# Patient Record
Sex: Female | Born: 1995 | Race: White | Hispanic: No | Marital: Single | Smoking: Never smoker
Health system: Southern US, Community
[De-identification: ages and names within clinical notes are randomized; demographics above are authoritative.]

---

## 2015-05-27 ENCOUNTER — Encounter: Payer: Self-pay | Admitting: Family Medicine

## 2015-05-27 ENCOUNTER — Ambulatory Visit (INDEPENDENT_AMBULATORY_CARE_PROVIDER_SITE_OTHER): Payer: Commercial Managed Care - PPO | Admitting: Family Medicine

## 2015-05-27 VITALS — BP 104/66 | HR 51 | Temp 98.2°F | Ht 59.25 in | Wt 129.5 lb

## 2015-05-27 DIAGNOSIS — Z0000 Encounter for general adult medical examination without abnormal findings: Secondary | ICD-10-CM

## 2015-05-27 DIAGNOSIS — Z01419 Encounter for gynecological examination (general) (routine) without abnormal findings: Secondary | ICD-10-CM

## 2015-05-27 LAB — CBC WITH DIFFERENTIAL/PLATELET
BASOS ABS: 0 10*3/uL (ref 0.0–0.1)
BASOS PCT: 0.8 % (ref 0.0–3.0)
Eosinophils Absolute: 0.2 10*3/uL (ref 0.0–0.7)
Eosinophils Relative: 3.4 % (ref 0.0–5.0)
HEMATOCRIT: 40.7 % (ref 36.0–49.0)
HEMOGLOBIN: 13.6 g/dL (ref 12.0–16.0)
LYMPHS ABS: 1.6 10*3/uL (ref 0.7–4.0)
Lymphocytes Relative: 30.6 % (ref 24.0–48.0)
MCHC: 33.6 g/dL (ref 31.0–37.0)
MCV: 90.9 fl (ref 78.0–98.0)
MONOS PCT: 8.7 % (ref 3.0–12.0)
Monocytes Absolute: 0.5 10*3/uL (ref 0.1–1.0)
NEUTROS ABS: 3 10*3/uL (ref 1.4–7.7)
Neutrophils Relative %: 56.5 % (ref 43.0–71.0)
Platelets: 240 10*3/uL (ref 150.0–575.0)
RBC: 4.47 Mil/uL (ref 3.80–5.70)
RDW: 13.8 % (ref 11.4–15.5)
WBC: 5.3 10*3/uL (ref 4.5–13.5)

## 2015-05-27 LAB — COMPREHENSIVE METABOLIC PANEL
ALBUMIN: 4.3 g/dL (ref 3.5–5.2)
ALT: 18 U/L (ref 0–35)
AST: 21 U/L (ref 0–37)
Alkaline Phosphatase: 51 U/L (ref 47–119)
BUN: 10 mg/dL (ref 6–23)
CALCIUM: 9.7 mg/dL (ref 8.4–10.5)
CHLORIDE: 105 meq/L (ref 96–112)
CO2: 28 mEq/L (ref 19–32)
Creatinine, Ser: 0.74 mg/dL (ref 0.40–1.20)
GFR: 106.97 mL/min (ref >60.00)
GLUCOSE: 82 mg/dL (ref 70–99)
POTASSIUM: 4.1 meq/L (ref 3.5–5.1)
SODIUM: 136 meq/L (ref 135–145)
TOTAL PROTEIN: 6.9 g/dL (ref 6.0–8.3)
Total Bilirubin: 0.7 mg/dL (ref 0.2–1.2)

## 2015-05-27 LAB — LIPID PANEL
CHOL/HDL RATIO: 2
Cholesterol: 127 mg/dL (ref 0–200)
HDL: 76.1 mg/dL (ref >39.00)
LDL CALC: 44 mg/dL (ref 0–99)
NonHDL: 50.9
TRIGLYCERIDES: 35 mg/dL (ref 0.0–149.0)
VLDL: 7 mg/dL (ref 0.0–40.0)

## 2015-05-27 LAB — VITAMIN B12: Vitamin B-12: 608 pg/mL (ref 211–911)

## 2015-05-27 LAB — VITAMIN D 25 HYDROXY (VIT D DEFICIENCY, FRACTURES): VITD: 23.52 ng/mL — AB (ref 30.00–100.00)

## 2015-05-27 LAB — TSH: TSH: 1.06 u[IU]/mL (ref 0.40–5.00)

## 2015-05-27 NOTE — Progress Notes (Signed)
Pre visit review using our clinic review tool, if applicable. No additional management support is needed unless otherwise documented below in the visit note. 

## 2015-05-27 NOTE — Patient Instructions (Signed)
It was nice to meet you. We will call you with your lab results and you can view them online. 

## 2015-05-27 NOTE — Assessment & Plan Note (Signed)
Reviewed preventive care protocols, scheduled due services, and updated immunizations Discussed nutrition, exercise, diet, and healthy lifestyle.  Orders Placed This Encounter  Procedures  . CBC with Differential/Platelet  . Comprehensive metabolic panel  . Lipid panel  . TSH  . Vitamin B12  . Vitamin D, 25-hydroxy    

## 2015-05-27 NOTE — Progress Notes (Signed)
Subjective:   Patient ID: Rebecca Williamson, female    DOB: May 13, 1996, 19 y.o.   MRN: [id number]  TESSICA CUPO is a pleasant 19 y.o. year old female who presents to clinic today with Establish Care  on 05/27/2015  HPI:  She would like a CPX.  Virginal.    Does not take any medications regularly.  Has no complaints today. Works as a Conservation officer, [employer].  No current outpatient prescriptions on file prior to visit.   No current facility-administered medications on file prior to visit.    No Known Allergies  History reviewed. No pertinent past medical history.  History reviewed. No pertinent past surgical history.  Family History  Problem Relation Age of Onset  . Migraines Mother   . Lymphoma Mother   . Migraines Sister   . Alcohol abuse Brother   . Alcohol abuse Maternal Uncle   . Stroke Maternal Grandmother   . Cancer Maternal Grandfather   . Hyperlipidemia Maternal Grandfather   . Hypertension Maternal Grandfather     History   Social History  . Marital Status: Single    Spouse Name: N/A  . Number of Children: N/A  . Years of Education: N/A   Occupational History  . Not on file.   Social History Main Topics  . Smoking status: Never Smoker   . Smokeless tobacco: Never Used  . Alcohol Use: No  . Drug Use: No  . Sexual Activity: No   Other Topics Concern  . Not on file   Social History Narrative  . No narrative on file   The PMH, PSH, Social History, Family History, Medications, and allergies have been reviewed in [facility], and have been updated if relevant.   Review of Systems  Constitutional: Negative.   HENT: Negative.   Eyes: Negative.   Respiratory: Negative.   Cardiovascular: Negative.   Gastrointestinal: Negative.   Endocrine: Negative.   Genitourinary: Negative.   Musculoskeletal: Negative.   Skin: Negative.   Allergic/Immunologic: Negative.   Neurological: Negative.   Hematological: Negative.   Psychiatric/Behavioral: Negative.   All other systems  reviewed and are negative.      Objective:    BP 104/66 mmHg  Pulse 51  Temp(Src) 98.2 F (36.8 C) (Oral)  Ht 4' 11.25" (1.505 m)  Wt 129 lb 8 oz (58.741 kg)  BMI 25.93 kg/m2  SpO2 97%  LMP 04/23/2015 (Within Weeks)   Physical Exam    General:  Well-developed,well-nourished,in no acute distress; alert,appropriate and cooperative throughout examination Head:  normocephalic and atraumatic.   Eyes:  vision grossly intact, pupils equal, pupils round, and pupils reactive to light.   Ears:  R ear normal and L ear normal.   Nose:  no external deformity.   Mouth:  good dentition.   Neck:  No deformities, masses, or tenderness noted. Breasts:  No mass, nodules, thickening, tenderness, bulging, retraction, inflamation, nipple discharge or skin changes noted.   Lungs:  Normal respiratory effort, chest expands symmetrically. Lungs are clear to auscultation, no crackles or wheezes. Heart:  Normal rate and regular rhythm. S1 and S2 normal without gallop, murmur, click, rub or other extra sounds. Abdomen:  Bowel sounds positive,abdomen soft and non-tender without masses, organomegaly or hernias noted. Msk:  No deformity or scoliosis noted of thoracic or lumbar spine.   Extremities:  No clubbing, cyanosis, edema, or deformity noted with normal full range of motion of all joints.   Neurologic:  alert & oriented X3 and gait normal.   Skin:  Intact without suspicious lesions or rashes Cervical Nodes:  No lymphadenopathy noted Axillary Nodes:  No palpable lymphadenopathy Psych:  Cognition and judgment appear intact. Alert and cooperative with normal attention span and concentration. No apparent delusions, illusions, hallucinations      Assessment & Plan:   Well woman exam No Follow-up on file.

## 2015-06-02 ENCOUNTER — Encounter: Payer: Self-pay | Admitting: *Deleted

## 2017-05-07 ENCOUNTER — Other Ambulatory Visit (HOSPITAL_COMMUNITY)
Admission: RE | Admit: 2017-05-07 | Discharge: 2017-05-07 | Disposition: A | Discharge status: Home / Self Care | Payer: Commercial Managed Care - PPO | Source: Ambulatory Visit | Attending: Family Medicine | Admitting: Family Medicine

## 2017-05-07 ENCOUNTER — Encounter: Payer: Self-pay | Admitting: Family Medicine

## 2017-05-07 ENCOUNTER — Ambulatory Visit (INDEPENDENT_AMBULATORY_CARE_PROVIDER_SITE_OTHER): Payer: Commercial Managed Care - PPO | Admitting: Family Medicine

## 2017-05-07 VITALS — BP 102/60 | HR 51 | Ht 59.75 in | Wt 122.8 lb

## 2017-05-07 DIAGNOSIS — Z124 Encounter for screening for malignant neoplasm of cervix: Secondary | ICD-10-CM | POA: Diagnosis not present

## 2017-05-07 DIAGNOSIS — Z01411 Encounter for gynecological examination (general) (routine) with abnormal findings: Secondary | ICD-10-CM | POA: Insufficient documentation

## 2017-05-07 DIAGNOSIS — R87610 Atypical squamous cells of undetermined significance on cytologic smear of cervix (ASC-US): Secondary | ICD-10-CM | POA: Insufficient documentation

## 2017-05-07 DIAGNOSIS — Z23 Encounter for immunization: Secondary | ICD-10-CM

## 2017-05-07 DIAGNOSIS — Z01419 Encounter for gynecological examination (general) (routine) without abnormal findings: Secondary | ICD-10-CM | POA: Diagnosis not present

## 2017-05-07 LAB — CBC WITH DIFFERENTIAL/PLATELET
Basophils Absolute: 0.1 10*3/uL (ref 0.0–0.1)
Basophils Relative: 1.6 % (ref 0.0–3.0)
EOS ABS: 0.2 10*3/uL (ref 0.0–0.7)
EOS PCT: 5.3 % — AB (ref 0.0–5.0)
HEMATOCRIT: 40.1 % (ref 36.0–46.0)
HEMOGLOBIN: 13.7 g/dL (ref 12.0–15.0)
LYMPHS PCT: 43.7 % (ref 12.0–46.0)
Lymphs Abs: 1.7 10*3/uL (ref 0.7–4.0)
MCHC: 34.1 g/dL (ref 30.0–36.0)
MCV: 91.4 fl (ref 78.0–100.0)
Monocytes Absolute: 0.3 10*3/uL (ref 0.1–1.0)
Monocytes Relative: 8.5 % (ref 3.0–12.0)
Neutro Abs: 1.6 10*3/uL (ref 1.4–7.7)
Neutrophils Relative %: 40.9 % — ABNORMAL LOW (ref 43.0–77.0)
Platelets: 209 10*3/uL (ref 150.0–400.0)
RBC: 4.39 Mil/uL (ref 3.87–5.11)
RDW: 13.2 % (ref 11.5–15.5)
WBC: 4 10*3/uL (ref 4.0–10.5)

## 2017-05-07 LAB — COMPREHENSIVE METABOLIC PANEL
ALK PHOS: 31 U/L — AB (ref 39–117)
ALT: 16 U/L (ref 0–35)
AST: 18 U/L (ref 0–37)
Albumin: 4.4 g/dL (ref 3.5–5.2)
BILIRUBIN TOTAL: 0.7 mg/dL (ref 0.2–1.2)
BUN: 10 mg/dL (ref 6–23)
CALCIUM: 9.4 mg/dL (ref 8.4–10.5)
CHLORIDE: 105 meq/L (ref 96–112)
CO2: 29 mEq/L (ref 19–32)
CREATININE: 0.72 mg/dL (ref 0.40–1.20)
GFR: 108.29 mL/min (ref >60.00)
Glucose, Bld: 92 mg/dL (ref 70–99)
Potassium: 3.7 mEq/L (ref 3.5–5.1)
Sodium: 140 mEq/L (ref 135–145)
TOTAL PROTEIN: 6.6 g/dL (ref 6.0–8.3)

## 2017-05-07 LAB — VITAMIN B12: Vitamin B-12: 583 pg/mL (ref 211–911)

## 2017-05-07 LAB — LIPID PANEL
CHOLESTEROL: 127 mg/dL (ref 0–200)
HDL: 76.8 mg/dL (ref >39.00)
LDL CALC: 43 mg/dL (ref 0–99)
NonHDL: 50.44
TRIGLYCERIDES: 36 mg/dL (ref 0.0–149.0)
Total CHOL/HDL Ratio: 2
VLDL: 7.2 mg/dL (ref 0.0–40.0)

## 2017-05-07 LAB — TSH: TSH: 1.27 u[IU]/mL (ref 0.35–4.50)

## 2017-05-07 LAB — VITAMIN D 25 HYDROXY (VIT D DEFICIENCY, FRACTURES): VITD: 26.36 ng/mL — AB (ref 30.00–100.00)

## 2017-05-07 NOTE — Assessment & Plan Note (Signed)
Reviewed preventive care protocols, scheduled due services, and updated immunizations Discussed nutrition, exercise, diet, and healthy lifestyle.  Pap smear done today.  Orders Placed This Encounter  Procedures  . CBC with Differential/Platelet  . Comprehensive metabolic panel  . Lipid panel  . TSH  . Vitamin D, 25-hydroxy  . Vitamin B12

## 2017-05-07 NOTE — Progress Notes (Signed)
Pre visit review using our clinic review tool, if applicable. No additional management support is needed unless otherwise documented below in the visit note. 

## 2017-05-07 NOTE — Patient Instructions (Signed)
Great to see you. We will call you with your lab results and you can view them online.  Look up Loestrin and tell me what you think.

## 2017-05-07 NOTE — Progress Notes (Signed)
Subjective:   Patient ID: Rebecca Williamson, female    DOB: 01/28/1996, 21 y.o.   MRN: [id number]  Rebecca Williamson is a pleasant 21 y.o. year old female who presents to clinic today with Annual Exam  on 05/07/2017  HPI:  Virginal.  Has never had a pap smear.  Interested in hearing about OCPs.  May concern sexual activity in the next couple of years.  Periods are light and regular.  Working at [employer].  Loves working there.  Exercises often.   No current outpatient prescriptions on file prior to visit.   No current facility-administered medications on file prior to visit.     No Known Allergies  No past medical history on file.  No past surgical history on file.  Family History  Problem Relation Age of Onset  . Migraines Mother   . Lymphoma Mother   . Migraines Sister   . Alcohol abuse Brother   . Alcohol abuse Maternal Uncle   . Stroke Maternal Grandmother   . Cancer Maternal Grandfather   . Hyperlipidemia Maternal Grandfather   . Hypertension Maternal Grandfather     Social History   Social History  . Marital status: Single    Spouse name: N/A  . Number of children: N/A  . Years of education: N/A   Occupational History  . Not on file.   Social History Main Topics  . Smoking status: Never Smoker  . Smokeless tobacco: Never Used  . Alcohol use No  . Drug use: No  . Sexual activity: No   Other Topics Concern  . Not on file   Social History Narrative  . No narrative on file   The PMH, PSH, Social History, Family History, Medications, and allergies have been reviewed in [facility], and have been updated if relevant.   Review of Systems  Constitutional: Negative.   HENT: Negative.   Eyes: Negative.   Respiratory: Negative.   Cardiovascular: Negative.   Gastrointestinal: Negative.   Endocrine: Negative.   Genitourinary: Negative.   Musculoskeletal: Negative.   Allergic/Immunologic: Negative.   Neurological: Negative.   Hematological: Negative.     Psychiatric/Behavioral: Negative.   All other systems reviewed and are negative.      Objective:    BP 102/60   Pulse (!) 51   Ht 4' 11.75" (1.518 m)   Wt 122 lb 12 oz (55.7 kg)   LMP 05/04/2017   SpO2 99%   BMI 24.17 kg/m    Physical Exam   General:  Well-developed,well-nourished,in no acute distress; alert,appropriate and cooperative throughout examination Head:  normocephalic and atraumatic.   Eyes:  vision grossly intact, PERRL Ears:  R ear normal and L ear normal externally, TMs clear bilaterally Nose:  no external deformity.   Mouth:  good dentition.   Neck:  No deformities, masses, or tenderness noted. Breasts:  No mass, nodules, thickening, tenderness, bulging, retraction, inflamation, nipple discharge or skin changes noted.   Lungs:  Normal respiratory effort, chest expands symmetrically. Lungs are clear to auscultation, no crackles or wheezes. Heart:  Normal rate and regular rhythm. S1 and S2 normal without gallop, murmur, click, rub or other extra sounds. Abdomen:  Bowel sounds positive,abdomen soft and non-tender without masses, organomegaly or hernias noted. Rectal:  no external abnormalities.   Genitalia:  Pelvic Exam:        External: normal female genitalia without lesions or masses        Vagina: normal without lesions or masses  Cervix: normal without lesions or masses        Adnexa: normal bimanual exam without masses or fullness        Uterus: normal by palpation        Pap smear: performed Msk:  No deformity or scoliosis noted of thoracic or lumbar spine.   Extremities:  No clubbing, cyanosis, edema, or deformity noted with normal full range of motion of all joints.   Neurologic:  alert & oriented X3 and gait normal.   Skin:  Intact without suspicious lesions or rashes Cervical Nodes:  No lymphadenopathy noted Axillary Nodes:  No palpable lymphadenopathy Psych:  Cognition and judgment appear intact. Alert and cooperative with normal attention  span and concentration. No apparent delusions, illusions, hallucinations       Assessment & Plan:   No diagnosis found. No Follow-up on file.

## 2017-05-07 NOTE — Addendum Note (Signed)
Addended by: Liane ComberHAVERS, NATASHA C on: 05/07/2017 11:01 AM   Modules accepted: Orders

## 2017-05-10 LAB — CYTOLOGY - PAP
DIAGNOSIS: UNDETERMINED — AB
HPV (WINDOPATH): NOT DETECTED

## 2017-05-21 ENCOUNTER — Other Ambulatory Visit: Payer: Self-pay | Admitting: Family Medicine

## 2017-05-21 ENCOUNTER — Encounter: Payer: Self-pay | Admitting: Family Medicine

## 2017-05-21 MED ORDER — NORETHINDRONE ACET-ETHINYL EST 1-20 MG-MCG PO TABS: 1.0000 | ORAL_TABLET | Freq: Every day | ORAL | 11 refills | Status: AC

## 2017-06-05 DIAGNOSIS — H93299 Other abnormal auditory perceptions, unspecified ear: Secondary | ICD-10-CM | POA: Diagnosis not present

## 2017-06-05 DIAGNOSIS — H93293 Other abnormal auditory perceptions, bilateral: Secondary | ICD-10-CM | POA: Diagnosis not present

## 2018-03-08 ENCOUNTER — Emergency Department: Payer: Commercial Managed Care - PPO

## 2018-03-08 ENCOUNTER — Emergency Department
Admission: EM | Admit: 2018-03-08 | Discharge: 2018-03-08 | Disposition: A | Discharge status: Home / Self Care | Payer: Commercial Managed Care - PPO | Attending: Emergency Medicine | Admitting: Emergency Medicine

## 2018-03-08 ENCOUNTER — Encounter: Payer: Self-pay | Admitting: Emergency Medicine

## 2018-03-08 DIAGNOSIS — S82451A Displaced comminuted fracture of shaft of right fibula, initial encounter for closed fracture: Secondary | ICD-10-CM | POA: Diagnosis not present

## 2018-03-08 DIAGNOSIS — S8991XA Unspecified injury of right lower leg, initial encounter: Secondary | ICD-10-CM | POA: Diagnosis present

## 2018-03-08 DIAGNOSIS — Y999 Unspecified external cause status: Secondary | ICD-10-CM | POA: Diagnosis not present

## 2018-03-08 DIAGNOSIS — S82831A Other fracture of upper and lower end of right fibula, initial encounter for closed fracture: Secondary | ICD-10-CM | POA: Diagnosis not present

## 2018-03-08 DIAGNOSIS — S82391A Other fracture of lower end of right tibia, initial encounter for closed fracture: Secondary | ICD-10-CM | POA: Diagnosis not present

## 2018-03-08 DIAGNOSIS — M79661 Pain in right lower leg: Secondary | ICD-10-CM | POA: Diagnosis not present

## 2018-03-08 DIAGNOSIS — Y9389 Activity, other specified: Secondary | ICD-10-CM | POA: Insufficient documentation

## 2018-03-08 DIAGNOSIS — W091XXA Fall from playground swing, initial encounter: Secondary | ICD-10-CM | POA: Insufficient documentation

## 2018-03-08 DIAGNOSIS — Y92838 Other recreation area as the place of occurrence of the external cause: Secondary | ICD-10-CM | POA: Diagnosis not present

## 2018-03-08 DIAGNOSIS — M25571 Pain in right ankle and joints of right foot: Secondary | ICD-10-CM | POA: Diagnosis not present

## 2018-03-08 MED ORDER — OXYCODONE-ACETAMINOPHEN 5-325 MG PO TABS
1.0000 | ORAL_TABLET | Freq: Three times a day (TID) | ORAL | 0 refills | Status: AC | PRN
Start: 2018-03-08 — End: ?

## 2018-03-08 NOTE — Discharge Instructions (Addendum)
Take medication as prescribed. Rest. Drink plenty of fluids.   Keep in splint and use crutches. Ice and elevate.   Follow-up with orthopedic this coming Tuesday as discussed, call first thing Monday morning to schedule appointment.  See above.  Follow up with your primary care physician this week as needed. Return to ER for new or worsening concerns.

## 2018-03-08 NOTE — ED Provider Notes (Signed)
[facility] ____________________________________________  Time seen: Approximately 9:10 PM  I have reviewed the triage vital signs and the nursing notes.   HISTORY  Chief Complaint Ankle Pain  HPI Rebecca Williamson is a 22 y.o. female presenting with mother at bedside for evaluation of right ankle and right lower leg pain after injury that just occurred prior to arrival.  Patient reports that she was swinging on a typical child's swing, but the chain broke causing her to fall.  States that she believes she fell directly on the side of her ankle.  Denies head injury, loss of consciousness or other pain or injuries.  States has been unable to weight-bear on right lower leg since time of injury.  No alleviating measures attempted prior to arrival.  Has not taken any over-the-counter medications. Pain worse with direct palpation and attempt at weight bearing.  Denies other aggravating factors.  Reports otherwise feels well.  Dianne Dun, MD: PCP Patient's last menstrual period was 03/06/2018 (exact date).Denies pregnancy.   History reviewed. No pertinent past medical history.  Patient Active Problem List   Diagnosis Date Noted  . Well woman exam with routine gynecological exam 05/07/2017  . Well woman exam 05/27/2015    History reviewed. No pertinent surgical history.   No current facility-administered medications for this encounter.   Current Outpatient Medications:  .  norethindrone-ethinyl estradiol (LOESTRIN 1/20, 21,) 1-20 MG-MCG tablet, Take 1 tablet by mouth daily., Disp: 1 Package, Rfl: 11 .  oxyCODONE-acetaminophen (PERCOCET/ROXICET) 5-325 MG tablet, Take 1 tablet by mouth every 8 (eight) hours as needed for severe pain. Do not drive while taking as can cause drowsiness., Disp: 8 tablet, Rfl: 0  Allergies Patient has no known allergies.  Family History  Problem Relation Age of Onset  . Migraines Mother   . Lymphoma Mother   . Migraines Sister   . Alcohol  abuse Brother   . Cancer Maternal Grandfather   . Hyperlipidemia Maternal Grandfather   . Hypertension Maternal Grandfather   . Alcohol abuse Maternal Uncle   . Stroke Maternal Grandmother     Social History Social History   Tobacco Use  . Smoking status: Never Smoker  . Smokeless tobacco: Never Used  Substance Use Topics  . Alcohol use: No  . Drug use: No    Review of Systems Constitutional: No fever/chills Cardiovascular: Denies chest pain. Respiratory: Denies shortness of breath. Gastrointestinal: No abdominal pain.  No nausea, no vomiting.   Musculoskeletal: Negative for back pain. As above.  Skin: Negative for rash. Neurological: Negative for headaches, focal weakness or numbness.    ____________________________________________   PHYSICAL EXAM:  VITAL SIGNS: ED Triage Vitals  Enc Vitals Group     BP 03/08/18 2012 110/69     Pulse Rate 03/08/18 2012 62     Resp 03/08/18 2012 18     Temp 03/08/18 2012 99.1 F (37.3 C)     Temp Source 03/08/18 2012 Oral     SpO2 03/08/18 2012 98 %     Weight --      Height --      Head Circumference --      Peak Flow --      Pain Score 03/08/18 2013 7     Pain Loc --      Pain Edu? --      Excl. in GC? --     Constitutional: Alert and oriented. Well appearing and in no acute distress. ENT  Head: Normocephalic and atraumatic. Cardiovascular: Normal rate, regular rhythm. Grossly normal heart sounds.  Good peripheral circulation. Respiratory: Normal respiratory effort without tachypnea nor retractions. Breath sounds are clear and equal bilaterally. No wheezes, rales, rhonchi. Musculoskeletal:  No midline cervical, thoracic or lumbar tenderness to palpation. Bilateral pedal pulses equal and easily palpated. Except: Mild to moderate diffuse soft tissue ankle edema noted- medial greater than lateral.  Right lower leg mid to distal fibula moderate tenderness to direct palpation, no visible deformity, no proximal tibia or  fibula or knee tenderness, 2+ DP and PT distal pulses equal bilaterally, normal distal sensation and capillary refill, gait not tested due to pain, right leg extremity otherwise nontender. Neurologic:  Normal speech and language. No gross focal neurologic deficits are appreciated. Speech is normal. No gait instability.  Skin:  Skin is warm, dry and intact. No rash noted. Psychiatric: Mood and affect are normal. Speech and behavior are normal. Patient exhibits appropriate insight and judgment   ___________________________________________   LABS (all labs ordered are listed, but only abnormal results are displayed)  Labs Reviewed - No data to display  RADIOLOGY  Dg Tibia/fibula Right  Result Date: 03/08/2018 CLINICAL DATA:  Right lower leg pain.  Distal tibial fracture. EXAM: RIGHT TIBIA AND FIBULA - 2 VIEW COMPARISON:  Right ankle 03/08/2018 FINDINGS: A comminuted fracture is again demonstrated involving the distal right fibular shaft. There is medial angulation of the distal fracture fragment. Mild impaction of fracture fragments. No change since previous study. Cortical irregularity along the posterior aspect of the distal tibial metaphysis suggesting a minimally displaced posterior malleolar fracture. Fracture line appears to extend to the articular surface. Proximal tibia and fibula are otherwise intact. Mild soft tissue swelling about the ankle. IMPRESSION: 1. Comminuted and mildly impacted fracture of the distal right fibular shaft with medial angulation of the distal fracture fragment. 2. Coronal fracture of the posterior malleolus of the distal tibial metaphysis with minimal cortical displacement. Electronically Signed   By: Burman NievesWilliam  Stevens M.D.   On: 03/08/2018 21:17   Dg Ankle Complete Right  Result Date: 03/08/2018 CLINICAL DATA:  Right ankle pain after a swing that the patient was on broken. EXAM: RIGHT ANKLE - COMPLETE 3+ VIEW COMPARISON:  None. FINDINGS: There is an acute fracture of  the posterior malleolus with 2 mm of dorsal displacement. A comminuted varus and dorsally angulated fracture involving the distal diaphysis of the fibula is noted. The tibiotalar, subtalar and midfoot articulations appear intact. There soft tissue swelling about the malleoli more so medially. No widening of the medial clear space of the ankle mortise. IMPRESSION: 1. Acute, closed fracture of the posterior malleolus with 2 mm of dorsal displacement of the fracture fragment. 2. Acute, comminuted distal diaphyseal fracture of the right fibula with varus and dorsal angulation of the main distal fracture fragment. 3. Soft tissue swelling about the malleoli. Electronically Signed   By: Tollie Ethavid  Kwon M.D.   On: 03/08/2018 20:43   X-rays also reviewed by myself. ____________________________________________   PROCEDURES Procedures   Right leg posterior OCL splint applied by tech.  Reviewed myself afterwards.  INITIAL IMPRESSION / ASSESSMENT AND PLAN / ED COURSE  Pertinent labs & imaging results that were available during my care of the patient were reviewed by me and considered in my medical decision making (see chart for details).  Very well-appearing patient.  No acute distress.  Presenting for evaluation of right leg pain post mechanical injury that occurred just prior to arrival.  X-ray  results above per radiologist, images reviewed, patient with acute comminuted distal fibular fracture with varus and dorsal angulation, posterior malleolus fracture with dorsal displacement of fracture fragment.  Called and discussed with orthopedic on-call, Dr. Odis Luster, who reviewed patient, and recommend posterior OCL splint, elevation and follow-up outpatient office this coming Tuesday.  Splint applied, crutches given.  Discussed in detail with patient outpatient plan, information given, call to schedule appointment and follow-up closely with orthopedic.  Percocet as needed for breakthrough pain. [place] controlled  substance database reviewed, no recent controlled substances documented.Discussed indication, risks and benefits of medications with patient.  Discussed follow up with Primary care physician this week. Discussed follow up and return parameters including no resolution or any worsening concerns. Patient verbalized understanding and agreed to plan.   ____________________________________________   FINAL CLINICAL IMPRESSION(S) / ED DIAGNOSES  Final diagnoses:  Displaced comminuted fracture of shaft of right fibula, initial encounter for closed fracture  Closed fracture of posterior malleolus of right tibia, initial encounter     ED Discharge Orders        Ordered    oxyCODONE-acetaminophen (PERCOCET/ROXICET) 5-325 MG tablet  Every 8 hours PRN     03/08/18 2127       Note: This dictation was prepared with Dragon dictation along with smaller phrase technology. Any transcriptional errors that result from this process are unintentional.         Renford Dills, NP 03/08/18 2227    Sharman Cheek, MD 03/09/18 [phone]

## 2018-03-08 NOTE — ED Notes (Signed)
Esign not working pt verbalized discharge instructions and has no questions at this time 

## 2018-03-08 NOTE — ED Triage Notes (Signed)
Patient was swinging in a swing today and it broke and she landed on it.  Patient 7/10 pain.  Shoe has not been removed but there is clear swelling to the ankle.

## 2018-03-08 NOTE — ED Notes (Signed)
Splint applied by EDT Judeth CornfieldStephanie

## 2018-03-12 DIAGNOSIS — S82424A Nondisplaced transverse fracture of shaft of right fibula, initial encounter for closed fracture: Secondary | ICD-10-CM | POA: Diagnosis not present

## 2018-04-02 DIAGNOSIS — S82424D Nondisplaced transverse fracture of shaft of right fibula, subsequent encounter for closed fracture with routine healing: Secondary | ICD-10-CM | POA: Diagnosis not present

## 2018-04-23 DIAGNOSIS — S82424D Nondisplaced transverse fracture of shaft of right fibula, subsequent encounter for closed fracture with routine healing: Secondary | ICD-10-CM | POA: Diagnosis not present

## 2019-04-23 ENCOUNTER — Telehealth: Payer: Self-pay | Admitting: Family Medicine

## 2019-04-23 NOTE — Telephone Encounter (Signed)
Called because it has been since 2018 since she last saw Dr Dayton Martes and didn't know if she was still seeing Dr Dayton Martes as her PCP and if not we need to update and if she is we need to schedule an appt.

## 2019-07-15 IMAGING — CR DG ANKLE COMPLETE 3+V*R*
1 series · 4 of 4 positions shown · non-contrast
Comparison: None.

CLINICAL DATA: Right ankle pain after a swing that the patient was
on broken.

EXAM:
RIGHT ANKLE - COMPLETE 3+ VIEW

[Series 1: dg ankle complete right · 0.14mm/px · 4 of 4 slices shown]
[im 1/4]
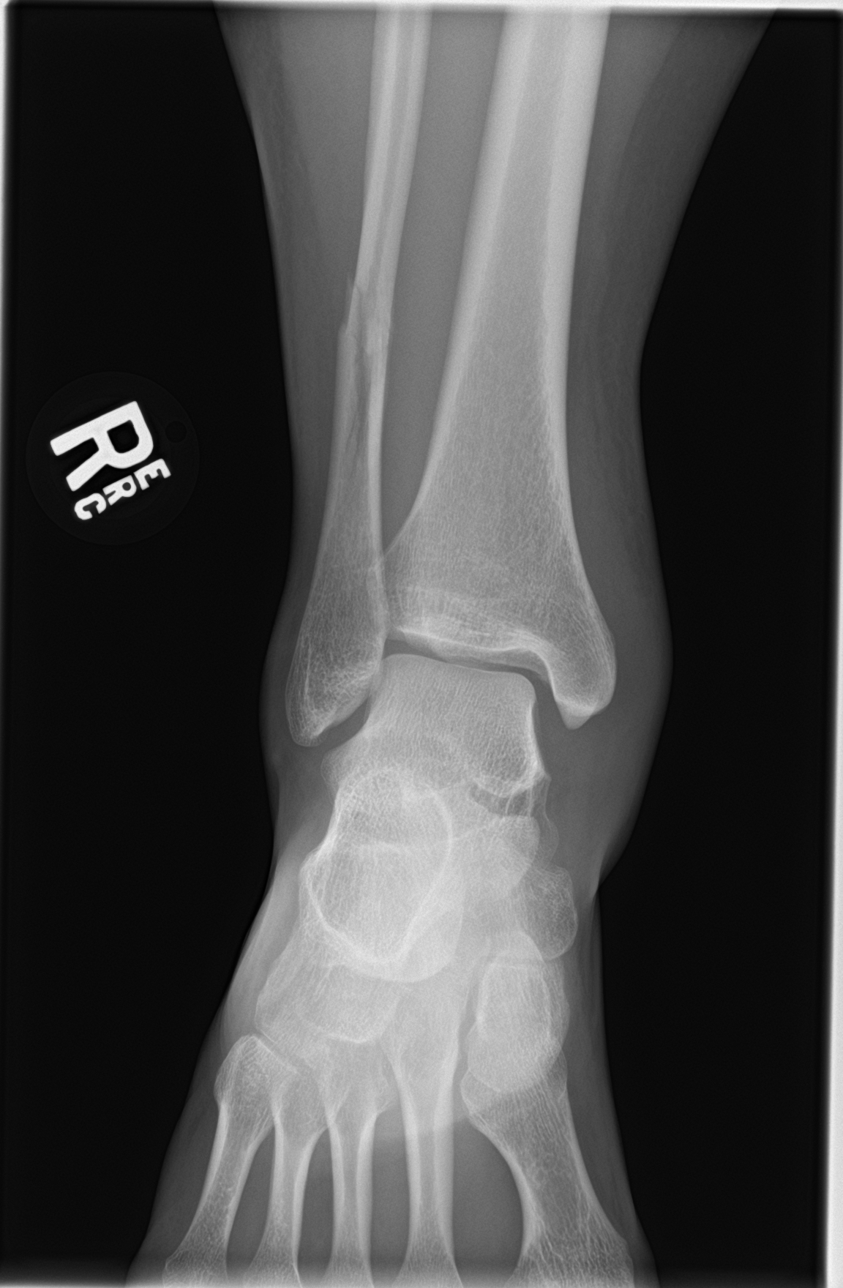
[im 2/4]
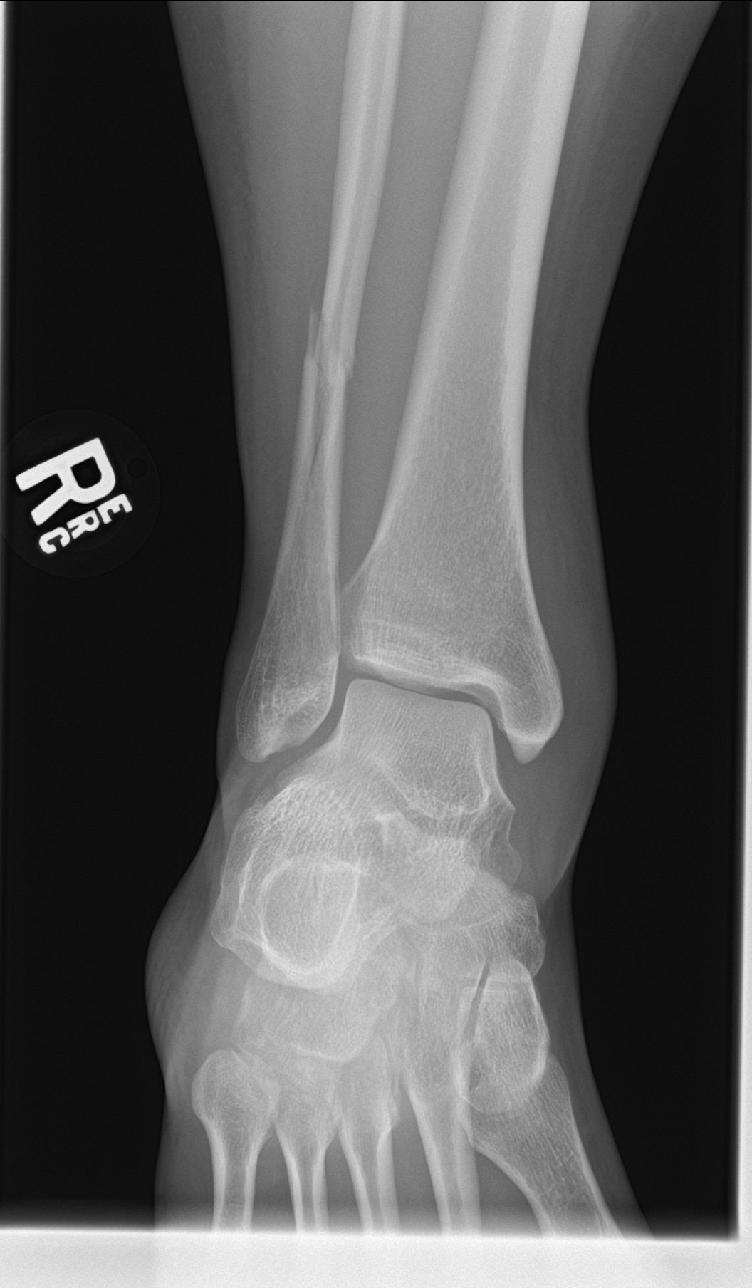
[im 3/4]
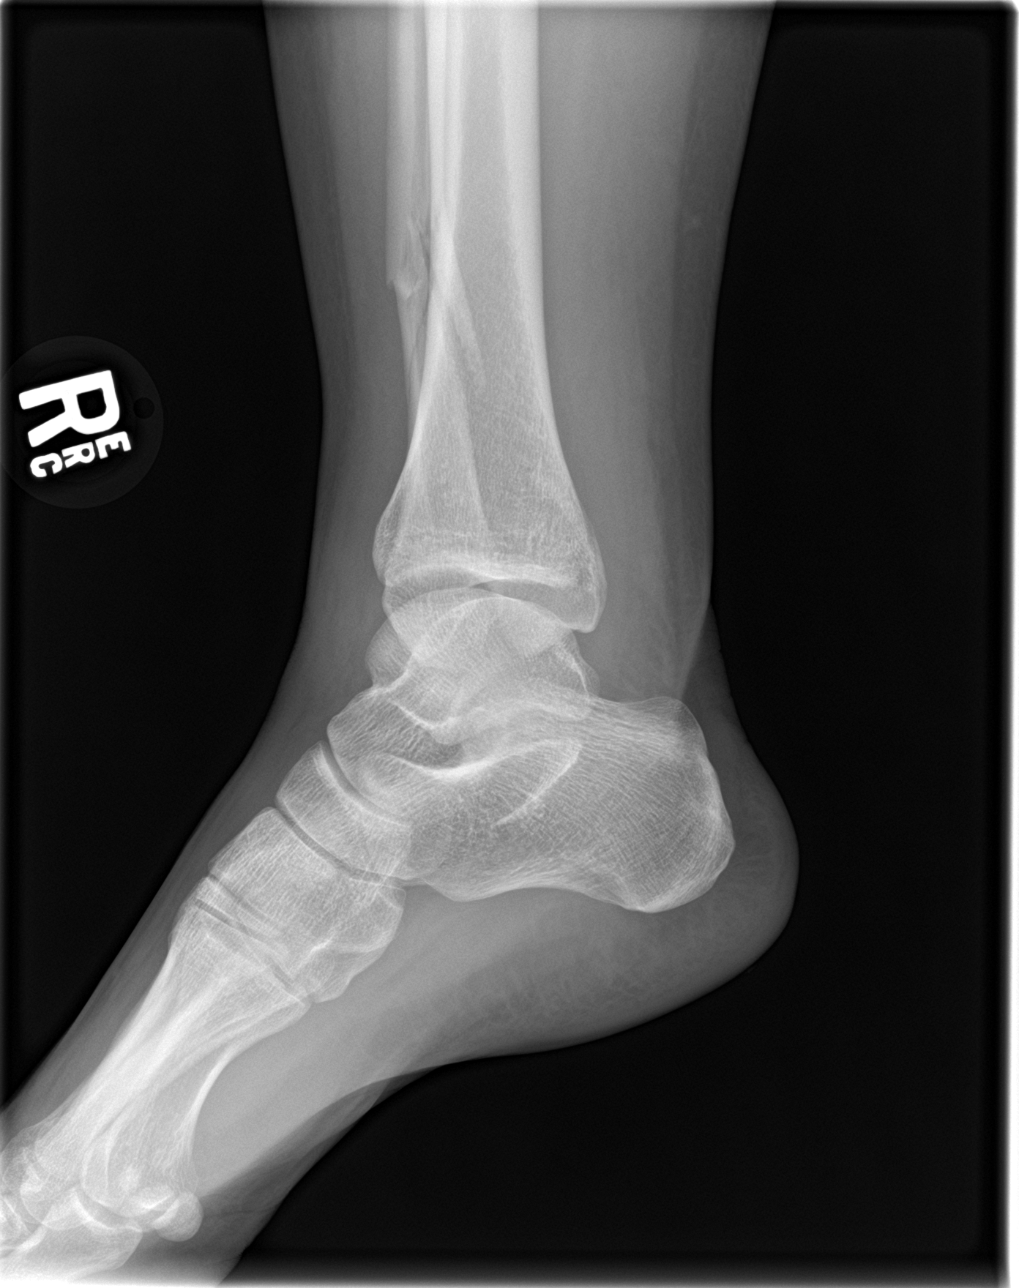
[im 4/4]
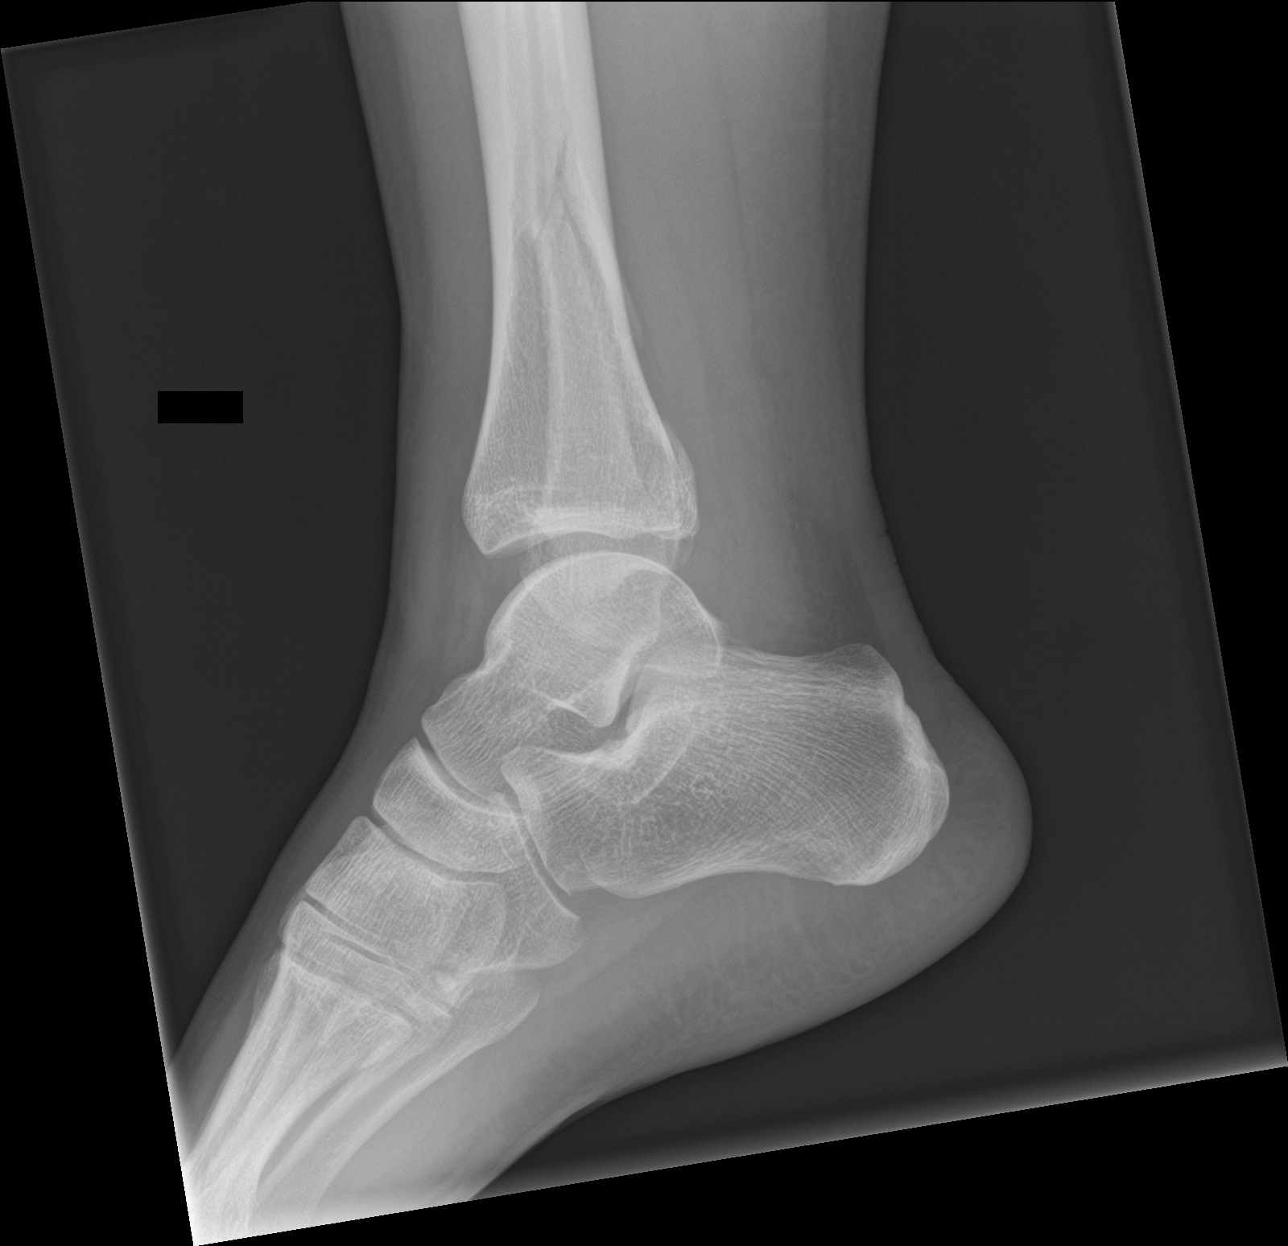

[4 of 4 positions shown; findings below may reference images not displayed]

FINDINGS: There is an acute fracture of the posterior malleolus with 2 mm of
dorsal displacement. A comminuted varus and dorsally angulated
fracture involving the distal diaphysis of the fibula is noted. The
tibiotalar, subtalar and midfoot articulations appear intact. There
soft tissue swelling about the malleoli more so medially. No
widening of the medial clear space of the ankle mortise.
IMPRESSION: 1. Acute, closed fracture of the posterior malleolus with 2 mm of
dorsal displacement of the fracture fragment.
2. Acute, comminuted distal diaphyseal fracture of the right fibula
with varus and dorsal angulation of the main distal fracture
fragment.
3. Soft tissue swelling about the malleoli.

## 2020-05-26 ENCOUNTER — Telehealth: Payer: Self-pay | Admitting: General Practice

## 2020-05-26 NOTE — Telephone Encounter (Signed)
Removed Dr. Aron as patient's PCP due to her leaving this practice and patient has never been seen at [facility]. 

## 2024-07-17 ENCOUNTER — Encounter (INDEPENDENT_AMBULATORY_CARE_PROVIDER_SITE_OTHER): Payer: Self-pay
